# Patient Record
Sex: Male | Born: 1955 | Race: White | Hispanic: No | Marital: Single | Smoking: Former smoker
Health system: Southern US, Community
[De-identification: ages and names within clinical notes are randomized; demographics above are authoritative.]

## PROBLEM LIST (undated history)

## (undated) DIAGNOSIS — I10 Essential (primary) hypertension: Secondary | ICD-10-CM

## (undated) DIAGNOSIS — Z7901 Long term (current) use of anticoagulants: Secondary | ICD-10-CM

## (undated) DIAGNOSIS — I4892 Unspecified atrial flutter: Secondary | ICD-10-CM

## (undated) DIAGNOSIS — J449 Chronic obstructive pulmonary disease, unspecified: Secondary | ICD-10-CM

## (undated) DIAGNOSIS — E785 Hyperlipidemia, unspecified: Secondary | ICD-10-CM

## (undated) DIAGNOSIS — I2510 Atherosclerotic heart disease of native coronary artery without angina pectoris: Secondary | ICD-10-CM

## (undated) HISTORY — DX: Chronic obstructive pulmonary disease, unspecified: J44.9

## (undated) HISTORY — DX: Unspecified atrial flutter: I48.92

## (undated) HISTORY — DX: Atherosclerotic heart disease of native coronary artery without angina pectoris: I25.10

## (undated) HISTORY — DX: Essential (primary) hypertension: I10

## (undated) HISTORY — DX: Hyperlipidemia, unspecified: E78.5

## (undated) HISTORY — DX: Long term (current) use of anticoagulants: Z79.01

---

## 2008-08-07 ENCOUNTER — Emergency Department: Payer: Self-pay | Admitting: Emergency Medicine

## 2008-10-19 HISTORY — PX: CORONARY ARTERY BYPASS GRAFT: SHX141

## 2008-11-18 ENCOUNTER — Ambulatory Visit: Payer: Self-pay | Admitting: Cardiology

## 2008-11-18 ENCOUNTER — Inpatient Hospital Stay (HOSPITAL_COMMUNITY): Admission: RE | Admit: 2008-11-18 | Discharge: 2008-11-24 | Payer: Self-pay | Admitting: *Deleted

## 2008-11-23 HISTORY — PX: OTHER SURGICAL HISTORY: SHX169

## 2008-11-27 ENCOUNTER — Ambulatory Visit: Payer: Self-pay | Admitting: Internal Medicine

## 2008-11-30 ENCOUNTER — Emergency Department: Payer: Self-pay | Admitting: Emergency Medicine

## 2008-12-01 ENCOUNTER — Ambulatory Visit: Payer: Self-pay | Admitting: Internal Medicine

## 2008-12-07 ENCOUNTER — Ambulatory Visit: Payer: Self-pay

## 2008-12-18 ENCOUNTER — Ambulatory Visit: Payer: Self-pay | Admitting: Internal Medicine

## 2008-12-28 ENCOUNTER — Ambulatory Visit: Payer: Self-pay | Admitting: Internal Medicine

## 2008-12-28 DIAGNOSIS — J449 Chronic obstructive pulmonary disease, unspecified: Secondary | ICD-10-CM | POA: Insufficient documentation

## 2008-12-28 DIAGNOSIS — I4892 Unspecified atrial flutter: Secondary | ICD-10-CM | POA: Insufficient documentation

## 2008-12-28 DIAGNOSIS — I2510 Atherosclerotic heart disease of native coronary artery without angina pectoris: Secondary | ICD-10-CM | POA: Insufficient documentation

## 2008-12-28 DIAGNOSIS — I10 Essential (primary) hypertension: Secondary | ICD-10-CM | POA: Insufficient documentation

## 2008-12-28 DIAGNOSIS — E785 Hyperlipidemia, unspecified: Secondary | ICD-10-CM | POA: Insufficient documentation

## 2008-12-28 DIAGNOSIS — J4489 Other specified chronic obstructive pulmonary disease: Secondary | ICD-10-CM | POA: Insufficient documentation

## 2009-05-10 ENCOUNTER — Encounter: Payer: Self-pay | Admitting: *Deleted

## 2009-05-29 ENCOUNTER — Inpatient Hospital Stay: Payer: Self-pay | Admitting: Internal Medicine

## 2009-06-27 ENCOUNTER — Inpatient Hospital Stay: Payer: Self-pay | Admitting: Internal Medicine

## 2009-12-30 ENCOUNTER — Ambulatory Visit: Payer: Self-pay | Admitting: Cardiovascular Disease

## 2010-02-21 ENCOUNTER — Ambulatory Visit: Payer: Self-pay | Admitting: Gastroenterology

## 2010-04-05 IMAGING — CR DG CHEST 1V PORT
1 series · 1 of 1 positions shown · non-contrast
Comparison: none

REASON FOR EXAM: CP
COMMENTS:

PROCEDURE:     DXR - DXR PORTABLE CHEST SINGLE VIEW  - June 26, 2009 [DATE]
RESULT:     Comparison: 05/29/2009

[view not recorded]
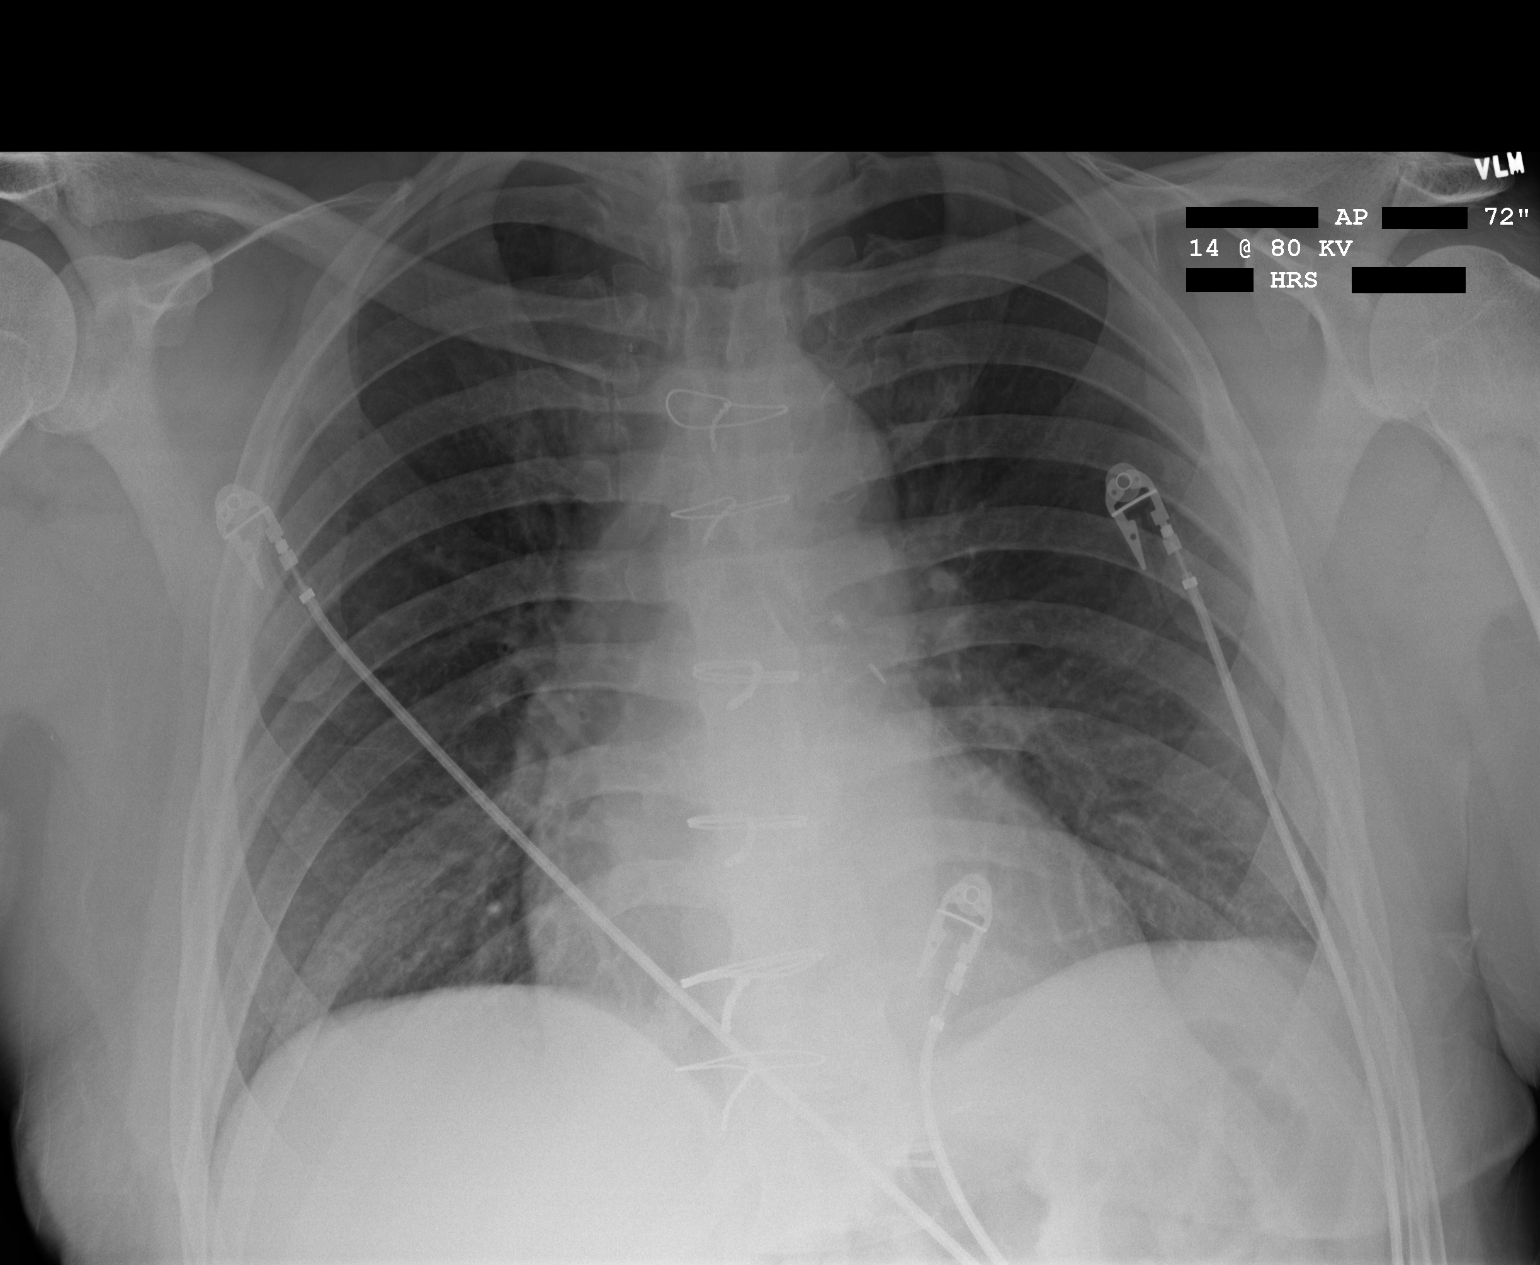

[1 of 1 positions shown; findings below may reference images not displayed]

FINDINGS: Single portable AP chest radiograph is provided. There is no focal
parenchymal opacity, pleural effusion, or pneumothorax. Normal
cardiomediastinal silhouette. There is evidence of prior CABG. The osseous
structures are unremarkable.
IMPRESSION: No acute disease of the chest.

## 2010-04-06 IMAGING — US US EXTREM LOW VENOUS BILAT
1 series · 18 of 24 positions shown · non-contrast
Comparison: none

REASON FOR EXAM: dyspnea, eval for DVT
COMMENTS:

PROCEDURE:     US  - US DOPPLER LOW EXTR BILATERAL  - June 27, 2009  [DATE]
RESULT:     Bilateral lower extremity Duplex Doppler reveals no evidence of
deep venous thrombosis. Color-flow analysis and compression studies are
negative.

[Series 1: us extrem low venous bilat · 18 of 58 slices shown]
[im 1/58]
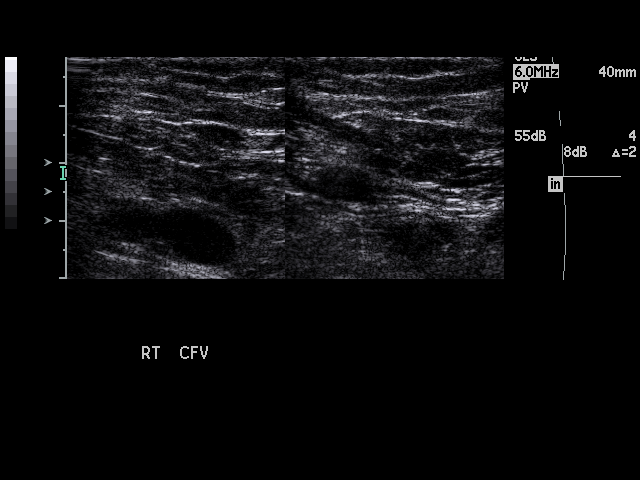
[im 5/58]
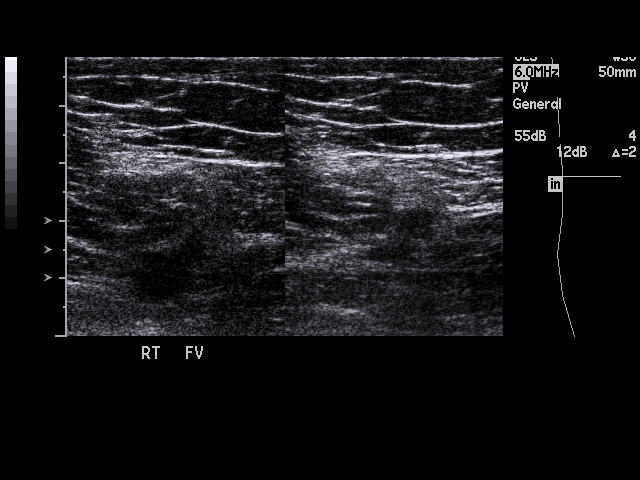
[im 8/58]
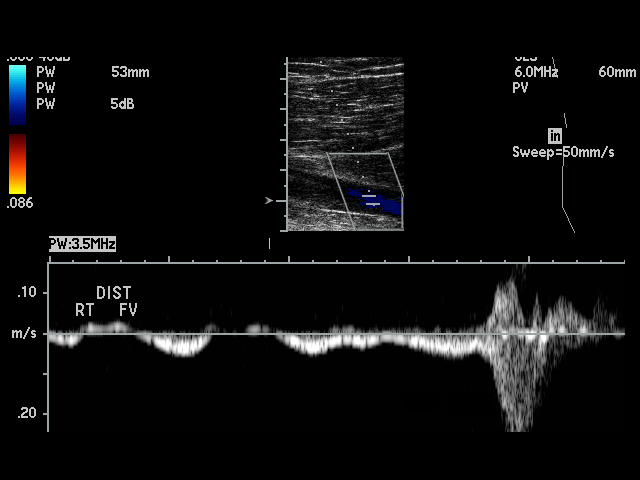
[im 10/58]
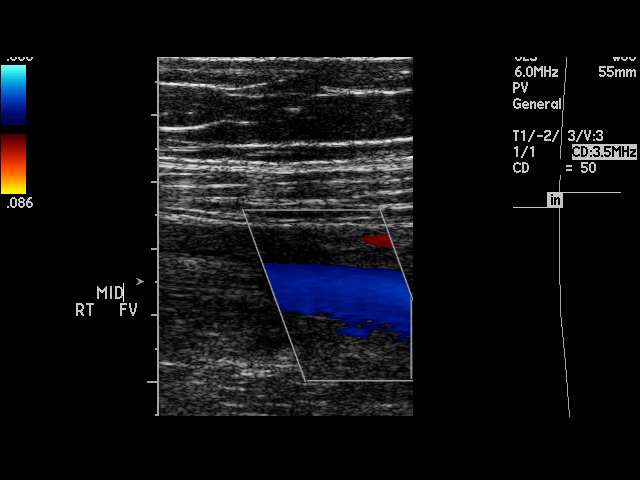
[im 15/58]
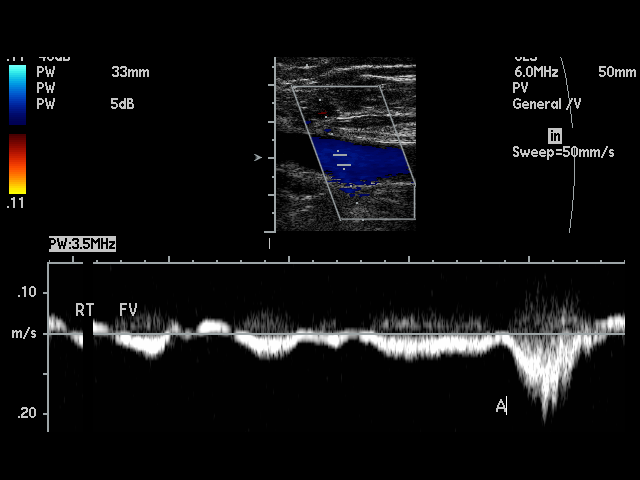
[im 18/58]
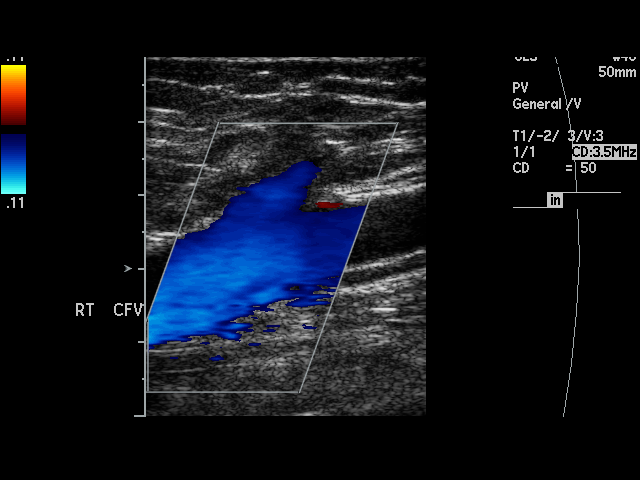
[im 20/58]
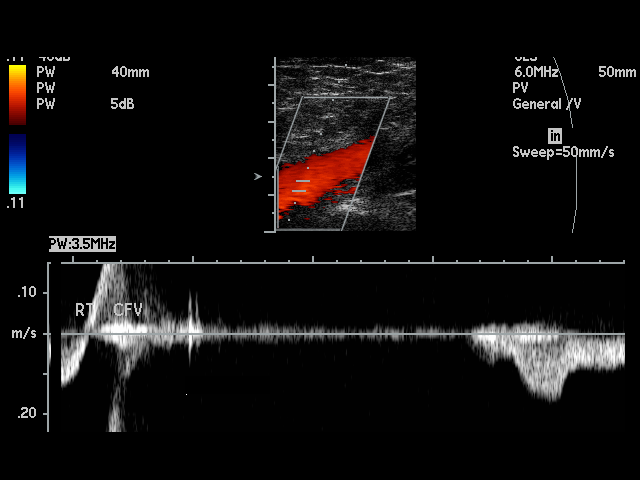
[im 25/58]
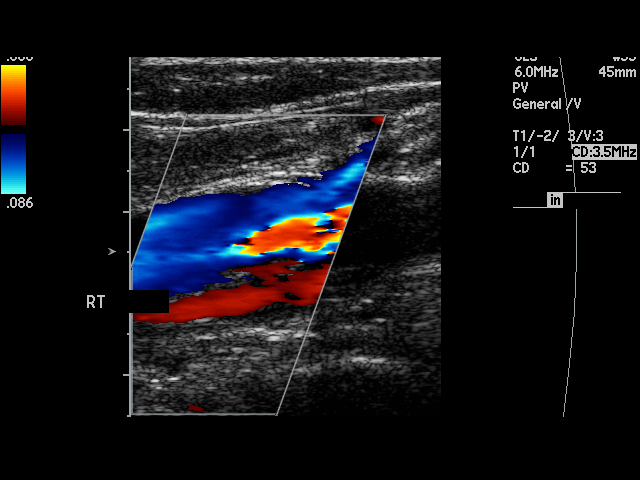
[im 28/58]
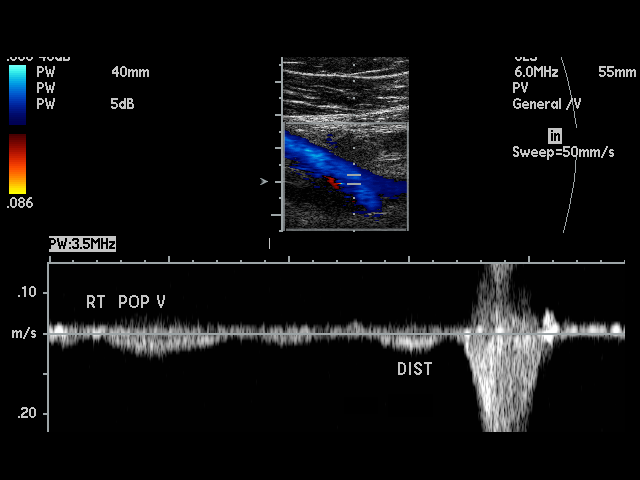
[im 30/58]
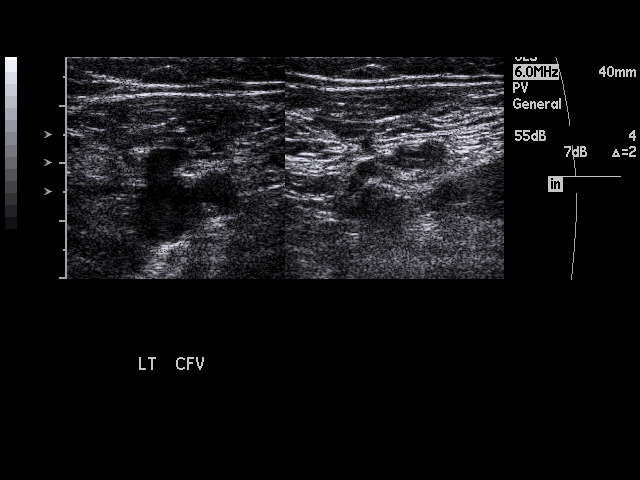
[im 35/58]
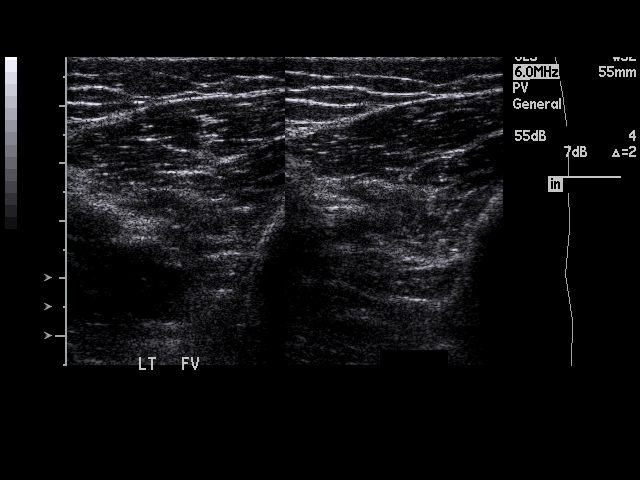
[im 38/58]
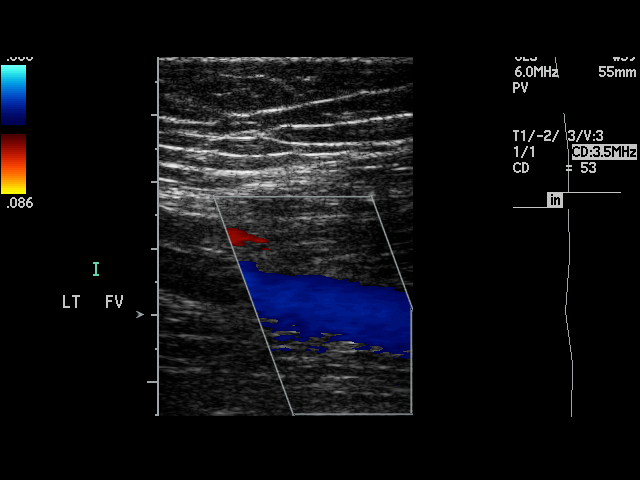
[im 40/58]
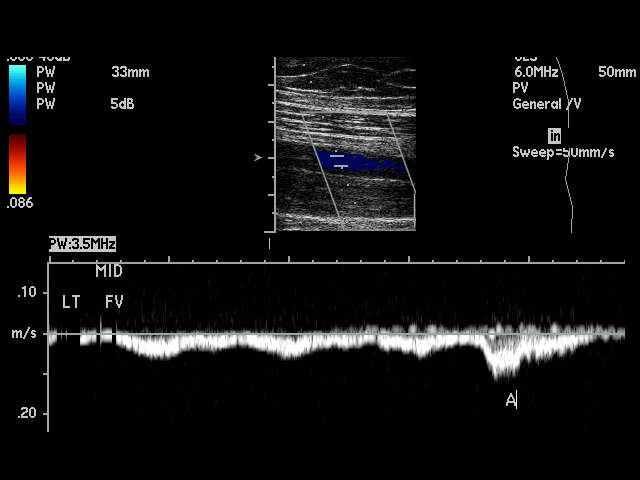
[im 45/58]
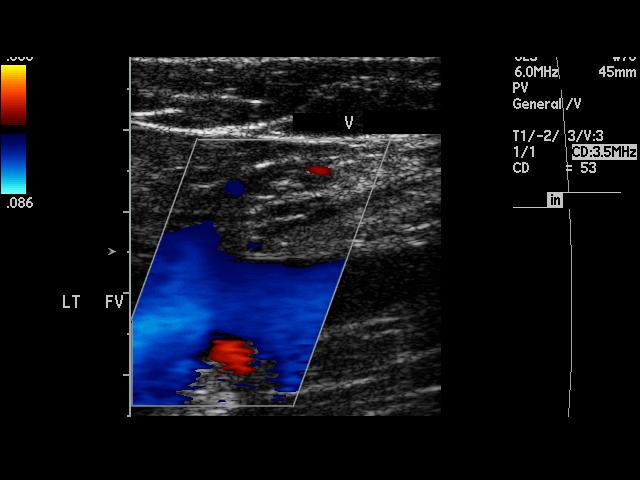
[im 48/58]
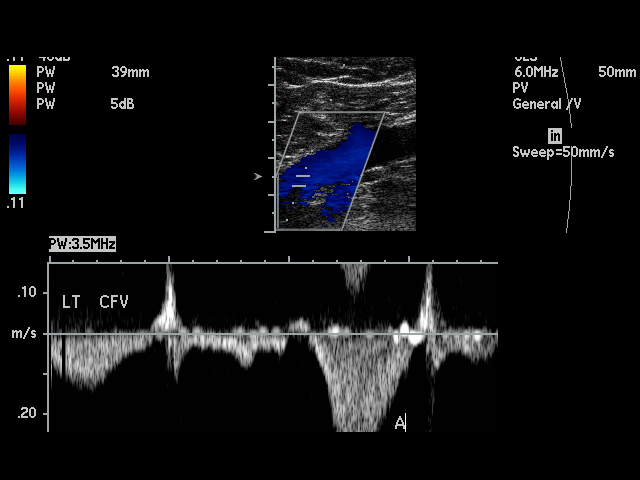
[im 50/58]
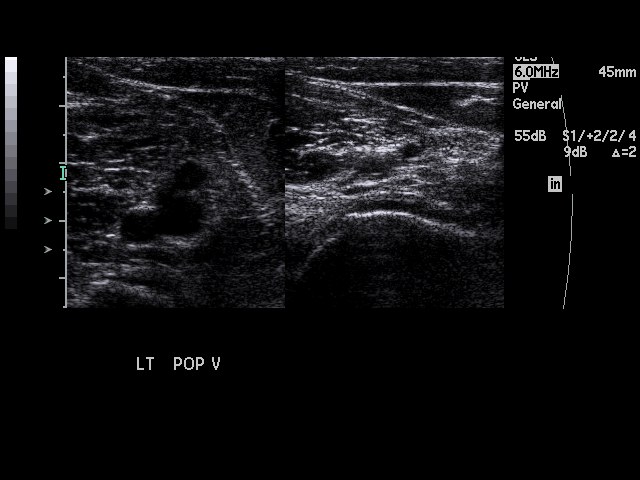
[im 55/58]
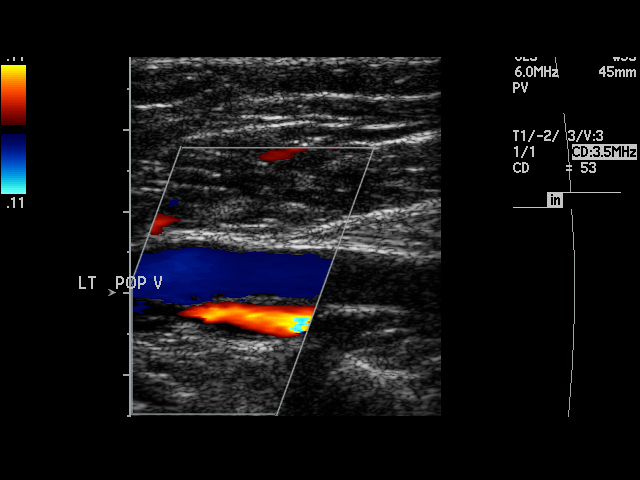
[im 58/58]
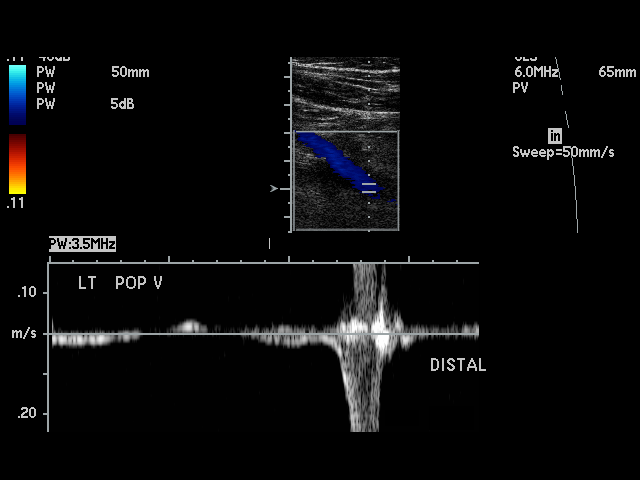

[18 of 24 positions shown; findings below may reference images not displayed]

IMPRESSION: Negative exam.

## 2010-08-07 ENCOUNTER — Inpatient Hospital Stay: Payer: Self-pay | Admitting: Internal Medicine

## 2010-09-07 ENCOUNTER — Ambulatory Visit: Payer: Self-pay | Admitting: Cardiovascular Disease

## 2011-01-05 LAB — BASIC METABOLIC PANEL
BUN: 16 mg/dL (ref 6–23)
Calcium: 9 mg/dL (ref 8.4–10.5)
Chloride: 104 mEq/L (ref 96–112)
Creatinine, Ser: 1.1 mg/dL (ref 0.4–1.5)
GFR calc Af Amer: >60 mL/min (ref >60)
GFR calc non Af Amer: >60 mL/min (ref >60)

## 2011-01-05 LAB — CBC
MCV: 84.3 fL (ref 78.0–100.0)
Platelets: 189 10*3/uL (ref 150–400)
Platelets: 195 10*3/uL (ref 150–400)
RBC: 4.15 MIL/uL — ABNORMAL LOW (ref 4.22–5.81)
RBC: 4.29 MIL/uL (ref 4.22–5.81)
WBC: 7.9 10*3/uL (ref 4.0–10.5)
WBC: 8.5 10*3/uL (ref 4.0–10.5)

## 2011-01-05 LAB — PROTIME-INR
INR: 2.2 — ABNORMAL HIGH (ref 0.00–1.49)
INR: 2.2 — ABNORMAL HIGH (ref 0.00–1.49)
Prothrombin Time: 25.6 seconds — ABNORMAL HIGH (ref 11.6–15.2)
Prothrombin Time: 25.9 seconds — ABNORMAL HIGH (ref 11.6–15.2)

## 2011-01-05 LAB — HEPARIN LEVEL (UNFRACTIONATED): Heparin Unfractionated: 0.4 IU/mL (ref 0.30–0.70)

## 2011-01-10 LAB — POCT I-STAT, CHEM 8
Calcium, Ion: 1.18 mmol/L (ref 1.12–1.32)
Chloride: 104 mEq/L (ref 96–112)
Glucose, Bld: 88 mg/dL (ref 70–99)
HCT: 36 % — ABNORMAL LOW (ref 39.0–52.0)
Hemoglobin: 12.2 g/dL — ABNORMAL LOW (ref 13.0–17.0)
Potassium: 3.8 mEq/L (ref 3.5–5.1)

## 2011-01-10 LAB — BASIC METABOLIC PANEL
BUN: 16 mg/dL (ref 6–23)
BUN: 17 mg/dL (ref 6–23)
BUN: 19 mg/dL (ref 6–23)
BUN: 19 mg/dL (ref 6–23)
BUN: 21 mg/dL (ref 6–23)
CO2: 23 mEq/L (ref 19–32)
CO2: 25 mEq/L (ref 19–32)
CO2: 26 mEq/L (ref 19–32)
Calcium: 8.1 mg/dL — ABNORMAL LOW (ref 8.4–10.5)
Calcium: 8.4 mg/dL (ref 8.4–10.5)
Calcium: 8.6 mg/dL (ref 8.4–10.5)
Chloride: 104 mEq/L (ref 96–112)
Chloride: 104 mEq/L (ref 96–112)
Chloride: 106 mEq/L (ref 96–112)
Creatinine, Ser: 1.08 mg/dL (ref 0.4–1.5)
Creatinine, Ser: 1.1 mg/dL (ref 0.4–1.5)
Creatinine, Ser: 1.12 mg/dL (ref 0.4–1.5)
Creatinine, Ser: 1.19 mg/dL (ref 0.4–1.5)
GFR calc Af Amer: >60 mL/min (ref >60)
GFR calc Af Amer: >60 mL/min (ref >60)
GFR calc non Af Amer: >60 mL/min (ref >60)
Glucose, Bld: 92 mg/dL (ref 70–99)
Glucose, Bld: 97 mg/dL (ref 70–99)
Potassium: 3.8 mEq/L (ref 3.5–5.1)
Potassium: 4.1 mEq/L (ref 3.5–5.1)
Potassium: 4.1 mEq/L (ref 3.5–5.1)

## 2011-01-10 LAB — CBC
HCT: 34.6 % — ABNORMAL LOW (ref 39.0–52.0)
HCT: 35 % — ABNORMAL LOW (ref 39.0–52.0)
HCT: 35.3 % — ABNORMAL LOW (ref 39.0–52.0)
HCT: 36 % — ABNORMAL LOW (ref 39.0–52.0)
HCT: 36.7 % — ABNORMAL LOW (ref 39.0–52.0)
Hemoglobin: 11.8 g/dL — ABNORMAL LOW (ref 13.0–17.0)
Hemoglobin: 11.9 g/dL — ABNORMAL LOW (ref 13.0–17.0)
Hemoglobin: 12 g/dL — ABNORMAL LOW (ref 13.0–17.0)
Hemoglobin: 12.2 g/dL — ABNORMAL LOW (ref 13.0–17.0)
MCHC: 34.2 g/dL (ref 30.0–36.0)
MCHC: 34.2 g/dL (ref 30.0–36.0)
MCHC: 34.9 g/dL (ref 30.0–36.0)
MCV: 84.3 fL (ref 78.0–100.0)
MCV: 85.2 fL (ref 78.0–100.0)
MCV: 85.5 fL (ref 78.0–100.0)
Platelets: 146 10*3/uL — ABNORMAL LOW (ref 150–400)
Platelets: 171 10*3/uL (ref 150–400)
RBC: 4 MIL/uL — ABNORMAL LOW (ref 4.22–5.81)
RBC: 4.05 MIL/uL — ABNORMAL LOW (ref 4.22–5.81)
RBC: 4.19 MIL/uL — ABNORMAL LOW (ref 4.22–5.81)
RBC: 4.36 MIL/uL (ref 4.22–5.81)
RDW: 14.8 % (ref 11.5–15.5)
RDW: 14.9 % (ref 11.5–15.5)
WBC: 8 10*3/uL (ref 4.0–10.5)
WBC: 8.3 10*3/uL (ref 4.0–10.5)
WBC: 8.5 10*3/uL (ref 4.0–10.5)
WBC: 8.6 10*3/uL (ref 4.0–10.5)
WBC: 8.6 10*3/uL (ref 4.0–10.5)

## 2011-01-10 LAB — GLUCOSE, CAPILLARY
Glucose-Capillary: 101 mg/dL — ABNORMAL HIGH (ref 70–99)
Glucose-Capillary: 103 mg/dL — ABNORMAL HIGH (ref 70–99)
Glucose-Capillary: 106 mg/dL — ABNORMAL HIGH (ref 70–99)
Glucose-Capillary: 111 mg/dL — ABNORMAL HIGH (ref 70–99)
Glucose-Capillary: 115 mg/dL — ABNORMAL HIGH (ref 70–99)
Glucose-Capillary: 93 mg/dL (ref 70–99)

## 2011-01-10 LAB — HEPARIN LEVEL (UNFRACTIONATED)
Heparin Unfractionated: 0.25 IU/mL — ABNORMAL LOW (ref 0.30–0.70)
Heparin Unfractionated: 0.42 IU/mL (ref 0.30–0.70)

## 2011-01-10 LAB — CARDIAC PANEL(CRET KIN+CKTOT+MB+TROPI): Relative Index: 18.8 — ABNORMAL HIGH (ref 0.0–2.5)

## 2011-01-10 LAB — PROTIME-INR
INR: 1 (ref 0.00–1.49)
INR: 1 (ref 0.00–1.49)
Prothrombin Time: 13.4 seconds (ref 11.6–15.2)
Prothrombin Time: 13.5 seconds (ref 11.6–15.2)

## 2011-02-07 NOTE — Discharge Summary (Signed)
NAME:  Andrew Bradshaw, OELKE NO.:  [id number]   MEDICAL RECORD NO.:  [id number]          PATIENT TYPE:  INP   LOCATION:  2920                         FACILITY:  [facility]   PHYSICIAN:  Doylene Canning. Ladona Ridgel, MD    DATE OF BIRTH:  1956/06/16   DATE OF ADMISSION:  11/18/2008  DATE OF DISCHARGE:  11/24/2008                               DISCHARGE SUMMARY   This patient has NO KNOWN DRUG ALLERGIES.   FINAL DIAGNOSES:  1. Admitted from [facility] with chest pain.      a.     ACUTE posterior wall myocardial infarction (STEMI).  2. Emergent catheterization November 18, 2008, the saphenous vein      graft to the ramus intermediate is totaled.  The patient underwent      thrombectomy of the distal saphenous vein graft to the ramus      intermediate and a stent was placed.      a.     Long-term outlook for patency of the saphenous vein graft to       the ramus intermediate is not good.  The patient was placed on       Plavix and aspirin kept on Integrilin for 24 hours after the       procedure.      b.     The left internal mammary artery to the left anterior       descending was patent, the saphenous vein graft to the diagonal       patent.  Patient has a right coronary artery that was totally       occluded proximally.  Ejection fraction at catheterization 35% -       40%.  3. Typical atrial flutter noted this admission, it is new.      a.     Transesophageal echocardiogram November 23, 2008:  Ejection       fraction 50%, no left atrial appendage thrombus.      b.     Electrophysiology study November 23, 2008:  Typical atrial       flutter with caval tricuspid isthmus ablation.  Creation of       bidirectional block.  The patient will be discharging day #1       status post ablation of the atrial flutter.   SECONDARY DIAGNOSES:  1. History of three-vessel coronary artery disease.      a.     Status post coronary artery bypass graft surgery October 19, 2008.  2.  Admission September of 2009 for left lower lobe cavitary pneumonia      requiring intervention, long-term intravenous antibiotic therapy.  3. History of hemorrhoids.  4. Perianal abscess status post incision and drainage 2003.  5. 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   Since October 2009 the patient has been living at the [facility].   BRIEF HISTORY:  Mr. [name] is a 55 year old male.  He has known coronary  artery disease.  He had bypass surgery October 19, 2008.   The  patient presented to [facility] Emergency Department with chest pain  and a code STEMI was called.  He was transferred by way of a emergency  medical services to [facility].  He was brought directly to the cath  lab.   Chest pain began at 4:30 on the day of February 24, it feels like  pressure, 8/10 in intensity.  It is similar to the pain from a previous  myocardial infarction.  This is associated with dyspnea.  The pain has  persisted despite aspirin and nitroglycerin given by emergency medical  services.  He was also given fentanyl and diltiazem.  Chest pain  continues as he lies on the table.   HOSPITAL COURSE:  The patient admitted from [place] with acute  posterior wall myocardial infarction.  He underwent emergency  catheterization.  The grafts which were placed at coronary artery bypass  graft surgery January 2010 were imaged, this study showed that the  saphenous vein graft to the ramus intermediate had burden of thrombus  near the distal anastomosis, it was totally occluded.  A thrombectomy  was performed, a PTCA at the anastomosis was performed and a stent was  placed.  Central narrowing from 100% to 50% and improvement in flow.  However, long-term chances of patency are small.  The patient will start  on aspirin and Plavix and go home with this indefinitely.  The other two  grafts, the LIMA to the LAD had a 70% narrowing in the distal portion of  the LIMA graft, the  vein graft to the diagonal branch of the LAD was  patent.  The ejection fraction was 35% - 40%.  Following emergent repair  in the cath lab the patient has had no following chest pain.  He has  developed however atrial flutter, this is a typical pattern.  He was  seen in consultation by Dr. Lewayne Bunting, electrophysiology.  Because  the duration of this atrial flutter was unknown he underwent  transesophageal echocardiogram the morning of November 23, 2008 and then  this was followed by electrophysiology study and radiofrequency catheter  ablation at the caval tricuspid isthmus, a bidirectional block was  created and the dysrhythmia became quiescent.  The patient has been in  sinus rhythm since the procedure.  The patient is also on Coumadin and  will continue on Coumadin as an outpatient for 4 more weeks.  This was  started on February 26 and is therapeutic at the time of his discharge.  The patient has a significant smoking history and will go home on  several medications aimed at his COPD.   MEDICATIONS AT DISCHARGE:  1. Plavix 75 mg daily, which is new.  2. Advair 250/50 one puff twice daily, this is new.  3. Pepcid 20 mg daily, new.  4. Lasix 20 mg daily, new.  5. Digoxin 0.25 mg daily, new.  6. Lisinopril 2.5 mg daily, a new dose.  7. Metoprolol succinate 100 mg daily, new.  8. Coumadin 5 mg tablets, he will take 5 mg on Tuesday March 2, 5 mg      Wednesday March 3, and 2.5 mg or 1/2 tablet Thursday March 4.  He      presents to the Coumadin Clinic Friday March 5.  9. Xopenex inhaler 45 mcg per inhalation, 1 puff every 6 hours, this      is new.  10.Ambien 5 mg to take at bedtime as needed for insomnia, this is also      new.  11.Enteric-coated aspirin 325 mg daily.  12.Pravastatin 40 mg daily at bedtime.  13.Lidoderm patch 5% daily.  14.Percocet 5/325 one to two tablets every 4 - 6 hours as needed for      pain.   The patient is to stop albuterol inhaler.  He is to stop  lisinopril at  the 5 mg daily dose.  He is to stop metoprolol 25 mg twice daily.  Follow-up with [facility] [place].   1. Coumadin Clinic Friday, March 5 at 10:00.  2. He sees Dr. Ladona Ridgel Monday, April 5 at 11:00.   He is to stay on Coumadin until he sees Dr. Ladona Ridgel.   LABORATORY STUDIES:  At the discharge the ProTime is 25.9, INR is 2.2.  Serum electrolytes on March 1:  Sodium was 136, potassium 3.9, chloride  104, carbonate 27, BUN is 16, creatinine 1.1, glucose 91.  Complete  blood count this admission on March 2:  Hemoglobin 12.1, hematocrit 35,  white cells 7.9, platelets are 195, troponin I studies as we have them  on admission February 25:  They were 11.96 and 12.59.      Maple Mirza, PA      Doylene Canning. Ladona Ridgel, MD  Electronically Signed    GM/MEDQ  D:  11/24/2008  T:  11/24/2008  Job:  [id number]   cc:   Doylene Canning. Ladona Ridgel, MD  [facility]

## 2011-02-07 NOTE — Progress Notes (Signed)
 HEALTHCARE                  [facility]' OFFICE NOTE   NAME:Ramus, AMEER                        MRN:          [id number]  DATE:12/28/2008                            DOB:          November 30, 1955    Mr. Geissinger returns for a followup.  He is a pleasant, middle-aged man  with a history of coronary artery disease, status post bypass surgery.  He is status post non-ST elevation MI with stent to the vein graft to  ramus intermedius, which has acutely occluded.  He also has a history of  atrial flutter with rapid ventricular response and underwent catheter  ablation back on November 23, 2008.  He was maintained on Coumadin and  returns today for followup.  The patient denies palpitations.  He denies  chest pain.   MEDICINES:  1. Plavix 75 a day.  2. Advair 250/50 one puff twice a day.  3. Pepcid 20 a day.  4. Lasix 20 a day.  5. Lisinopril 2.5 a day.  6. Metoprolol 100 a day.  7. Warfarin as directed.  8. Xopenex 45 mcg inhaled q.6 h.  9. Aspirin 325 a day.  10.Pravachol 40 a day.  11.Magnesium oxide.  12.Colace 100 twice a day.   PHYSICAL EXAMINATION:  GENERAL:  He is a pleasant, middle-aged, diskempt  man in no distress.  VITAL SIGNS:  Blood pressure was 143/99, the pulse was 100 and regular,  respirations were 18, and the weight was 210 pounds.  NECK:  No jugular venous distention.  LUNGS:  Clear bilaterally to auscultation.  No wheezes, rales, or  rhonchi are present.  There is no increased work of breathing.  CARDIOVASCULAR:  Regular rate and rhythm.  Normal S1 and S2.  ABDOMEN:  Soft, nontender, and nondistended.  There is no organomegaly.  EXTREMITIES:  No cyanosis, clubbing, or edema.  The pulses are 2+  symmetric.   EKG demonstrates sinus rhythm with sinus tachycardia with left atrial  enlargement and prior inferior MI pattern.   IMPRESSION:  1. Ischemic heart disease, status post bypass surgery, status post      stent to  the ramus intermedius.  2. Atrial flutter, status post ablation.  3. Hypertension.  4. Chronic obstructive pulmonary disease.   DISCUSSION:  Mr. Poser is stable.  His atrial flutter has been  controlled since his ablation.  At this point, I recommend that he stop  his Coumadin therapy.  We will continue on his other medical therapies  including aspirin and Plavix.  I will see the patient back as needed.  Because of his ongoing coronary artery disease, having follow up with  one of our additional cardiologist.     Doylene Canning. Ladona Ridgel, MD  Electronically Signed    GWT/MedQ  DD: 12/28/2008  DT: 12/28/2008  Job #: [id number]

## 2011-02-07 NOTE — Discharge Summary (Signed)
NAME:  REBECCA, MOTTA NO.:  [id number]   MEDICAL RECORD NO.:  [id number]          PATIENT TYPE:  INP   LOCATION:  2920                         FACILITY:  [facility]   PHYSICIAN:  Doylene Canning. Ladona Ridgel, MD    DATE OF BIRTH:  1956-03-07   DATE OF ADMISSION:  11/18/2008  DATE OF DISCHARGE:                               DISCHARGE SUMMARY   ADDENDUM:  The time for the dictation that I have just done priority  wise was greater than 45 minutes.      Maple Mirza, PA      Doylene Canning. Ladona Ridgel, MD  Electronically Signed    GM/MEDQ  D:  11/24/2008  T:  11/24/2008  Job:  [id number]

## 2011-02-07 NOTE — Consult Note (Signed)
NAME:  CHAN, SHEAHAN NO.:  [id number]   MEDICAL RECORD NO.:  [id number]          PATIENT TYPE:  INP   LOCATION:  2920                         FACILITY:  [facility]   PHYSICIAN:  Noralyn Pick. Eden Emms, MD, FACCDATE OF BIRTH:  1956-08-22   DATE OF CONSULTATION:  11/23/2008  DATE OF DISCHARGE:                                 CONSULTATION   INDICATION:  A 55 year old patient pre-flutter ablation.   The patient was sedated with 100 mcg of fentanyl and 10 mg of Versed.   Using digital technique, an Omniplane probe was advanced into the  esophagus without incident.  Transgastric imaging revealed mild left  ventricular cavity enlargement with mild-to-moderate LVH, EF was 45-50%  with global hypokinesis, slightly worse in the inferior wall, mitral  valve was mildly thickened with trivial MR, aortic valve was trileaflet  and minimally sclerotic.  There was mild biatrial enlargement.  Right-  sided cardiac chambers were otherwise normal.  Atrial septum was intact  with no ASD.   Left atrial appendage was extremely small and hypercontractile.  There  is no spontaneous contrast or thrombus.   Imaging of the aorta showed no significant bleeding.   IMPRESSION:  No left atrial appendage clot.  See above for details.  The  patient will proceed with atrial flutter ablation.      Noralyn Pick. Eden Emms, MD, [facility]  Electronically Signed     PCN/MEDQ  D:  11/23/2008  T:  11/24/2008  Job:  [id number]

## 2011-02-07 NOTE — Cardiovascular Report (Signed)
NAME:  Andrew Bradshaw, Andrew Bradshaw NO.:  [id number]   MEDICAL RECORD NO.:  [id number]          PATIENT TYPE:  INP   LOCATION:  2909                         FACILITY:  [facility]   PHYSICIAN:  Bruce R. Juanda Chance, MD, FACCDATE OF BIRTH:  1956/07/14   DATE OF PROCEDURE:  11/18/2008  DATE OF DISCHARGE:                            CARDIAC CATHETERIZATION   CLINICAL HISTORY:  Andrew Bradshaw is 55 years old.  He had bypass surgery in  [facility] a month ago with a LIMA to the LAD, a vein graft to the  ramus, a vein graft to the diagonal branch of the LAD.  All his vessels  were diffusely diseased and the right coronary artery was not suitable  for grafting.  He developed chest pain today and went to [facility] where his ECG showed 1-mm ST depression in V2 and V3.  He was  transferred emergently to [facility] and a code STEMI was called.  He  was taken directly to the cath lab.   PROCEDURE:  The procedure was performed via the right femoral artery  using arterial sheath and 6-French preformed coronary catheters.  A  front wall arterial puncture was performed and Omnipaque contrast was  used.  After we identified that there was total occlusion of the vein  graft to the circumflex artery, preparation made for intervention.  The  patient was given antiemetics bolus and infusion and subsequently  received bail out  double bolus Integrilin.  He was given 600 mg of  Plavix and had been previously given 4 chewable aspirin.  We used an AL-  1 6-French guiding catheter with side holes.  We were unable to pass a  Prowater wire across the lesion, but were able to pass a PT2 Light  Support wire across the lesion.  We did not get any distal flow,  however.  We then performed balloon inflations with a 2-0 balloon up and  down the vein graft and established some flow into the distal native  vessel.  It appeared there was a tight stenosis at the anastomosis and  we dilated this with a 2-0 balloon.   There appeared to be a large amount  of thrombus.  We still did not have good flow, so we stented the lesion  at the anastomosis because the result with a PTCA was very suboptimal.  We used a 2.25 x 8-mm Cypher stent deployed at 9 atmospheres.  The flow  was still TIMI 2 and there was thrombus evident in the shaft of the vein  graft, so we went in with an AngioJet and performed a total of 2 passes.  This cleaned up the vein graft very well in its proximal midportion, but  at the distal portion where there was a band, there appeared to be a  filling defect and we were not sure if this was thrombus or dissection.  We then elected to stent this with a 3.5 x 15-mm Vision stent.  This did  not help very much and there still appeared to be thrombus within the  stent.  We then passed a  Fetch catheter and gave intracoronary verapamil  hoping this will improved the distal flow, but it did not make very much  difference.   Following completion of the PCI, we performed angiography of the LIMA  graft and then did a left ventriculogram.  Despite the difficulty of the  procedure, the patient tolerated the procedure well and left the  laboratory in stable condition.   RESULTS:  Left main coronary artery:  The left main coronary artery is  free of significant disease.   Left anterior descending artery:  The left anterior descending artery  gave rise to a small and larger diagonal branch and a large septal  perforator and then there was 80% stenosis in the mid LAD with competing  flow distally.  There was 90% stenosis in the second diagonal branch  which was grafted.   The circumflex artery:  The circumflex artery is a moderate-sized vessel  that was diffusely diseased.  It gave rise to a ramus branch and was  completely occluded.  There was a 90% proximal stenosis.  There were 2  posterolateral branches which were diffusely diseased with diffuse 90%  stenoses.   The right coronary artery:  The  right coronary artery was completely  occluded  proximally.  The distal posterior descending branch was a  small vessel filled via collaterals from the left coronary artery.   The saphenous vein graft to the diagonal branch of the LAD was patent  and function normally, but there was a big mismatch between the graft  and the vessel and there was 80% narrowing in the diagonal branch just  after anastomosis.   The LIMA graft to LAD was patent and function normally, but there was  70% narrowing in the LIMA graft just before the anastomosis.   The saphenous vein graft to the ramus branch was completely occluded.   The left ventriculogram:  The left ventriculogram performed in the RAO  projection showed akinesis of the inferior wall all the way up to the  tip of the apex.  The estimated ejection fraction was 35-40%.   Following aspiration from AngioJet thrombectomy and stenting of the  anastomotic lesion and the distal portion of the vein graft, the  stenosis in the vein graft improved from 100% to 50% in the distal  portion of the vein graft with flow going from TIMI zero to a very slow  TIMI 2 flow.  The distal ramus branch was not a very large vessel.   The patient arrived at [facility] via EMS at 1850 in the cath  lab.  The first balloon inflation was performed at 1937.  This gave a  balloon time of 47 minutes.   CONCLUSION:  1. Coronary artery disease, status post prior coronary artery bypass      graft surgery in January, 1 month ago.  2. Acute posterior wall myocardial infarction due to occlusion of the      vein graft to the ramus branch of circumflex artery.  3. Severe native vessel disease with 80% narrowing in the mid LAD and      90% narrowing in the second diagonal branch of the LAD, total      occlusion of the ramus branch of circumflex artery and 90% stenosis      in the proximal circumflex artery with diffuse 90% lesions in the 2      posterolateral branch of  the circumflex artery and total occlusion      of the right  coronary artery.  4. Patent vein graft to diagonal branch of the LAD, patent LIMA graft      to the LAD with 70% narrowing at the distal portion of the LIMA      graft and occluded vein graft to the ramus branch of the circumflex      artery.  5. Inferior wall akinesis with an estimated ejection fraction of 35-      40%.  6. Suboptimal PTCA result.  The totally occluded vein graft to the      ramus branch of circumflex artery with improvement in central      narrowing from 100% to 50% and improvement in the flow from TIMI      zero to TIMI 2 flow.   DISPOSITION:  The patient returned to [facility] room for further  observation.  The chances of long-term patency of this graft are small.  We will plan on continued medical management.  We will plan to treat him  with Integrilin for 12 hours.      Bruce Elvera Lennox Juanda Chance, MD, [facility]  Electronically Signed    BRB/MEDQ  D:  11/18/2008  T:  11/19/2008  Job:  [id number]   cc:   Luis Abed, MD, [facility]  Cardiopulmonary Lab

## 2011-02-07 NOTE — Op Note (Signed)
NAME:  Andrew Bradshaw, Andrew Bradshaw NO.:  [id number]   MEDICAL RECORD NO.:  [id number]          PATIENT TYPE:  INP   LOCATION:  2920                         FACILITY:  [facility]   PHYSICIAN:  Doylene Canning. Ladona Ridgel, MD    DATE OF BIRTH:  [birth year]   DATE OF PROCEDURE:  11/23/2008  DATE OF DISCHARGE:                               OPERATIVE REPORT   PROCEDURE PERFORMED:  Electrophysiologic study, now for catheter  ablation of atrial flutter.   The patient is a 55 year old male with coronary disease, status post  bypass surgery in the past who presented to the hospital with atrial  flutter and a rapid ventricular response and actually ruled in for a non-  Q-wave myocardial infarction.  He has stabilized, but his flutter has  persisted.  He underwent transesophageal echo demonstrating no left  appendage thrombus and is now referred for electrophysiologic study and  catheter ablation of his atrial flutter.   DESCRIPTION OF PROCEDURE:  After informed consent was obtained, the  patient was taken to the [facility] in a fasting state.  After __________ preparation and draping, intravenous valium and  fentanyl was given for sedation.  A 5-French quadripolar catheter was  inserted percutaneously in the right femoral vein and advanced to the  His bundle region.  A 6-French Hexaport catheter was inserted  percutaneously in the right jugular vein and advanced to the coronary  sinus.  A 7-French quadripolar ablation catheter was inserted  percutaneously in the right femoral vein and advanced to the atrial  flutter isthmus.  Mapping was carried out.  This demonstrated typical  counterclockwise tricuspid annular reentrant atrial flutter with a cycle  length of 200 msec.  Three RF energy applications were delivered and  flutter was terminated, sinus rhythm restored, but spontaneously  returned.  Two additional RF energy applications were then delivered,  resulting in termination of  flutter.  Isthmus block was then  demonstrated with a 6R RF energy application and a 7th bonus RF energy  application was delivered and the patient was observed for approximately  20 minutes at which time there was no atrial flutter isthmus conduction.  The ablation catheter was moved into the right ventricle and rapid  ventricular pacing was carried out at  a cycle length 600 msec and  stepwise decreased and this demonstrated VA dissociation at 600 msec and  stepwise decreased and this demonstrated VA dissection at 600 msec.  Program stimulation was carried out from the RV apex, also demonstrating  VA dissociation at 600 msec.  Next, programmed atrial stimulation was  carried out from the coronary sinus and the high right atrium  demonstrating persistent atrial flutter isthmus block.  Rapid atrial  pacing was carried out down to 380 msec where AV Wenckebach was  observed.  Programmed extrastimulation was carried out down to S1-S2  interval with 600/300 with the AV node ERP observed.  During programmed  extrastimulation there were no H jumps, no echo beats, no inducible SVT.  At this point, the catheters were removed, hemostasis assured and the  patient was returned to his room  in satisfactory condition.  It should  be noted that during the procedure the patient was very difficult to  sedate secondary to prior benzodiazepine tolerance.  Intravenous  fentanyl worked reasonably well in sedation, however.   COMPLICATIONS:  There were no procedure complications.   RESULTS:  A.  Baseline ECG demonstrates atrial flutter with a rapid  ventricular response.  B.  Baseline intervals.  the atrial flutter cycle length was 200 msec,  the And social he the patient for surgery denies drugs alcohol  20.__________ 40 msec, the QRS duration was 100 msec.  C.  Rapid ventricular pacing demonstrated VA dissociation at 600 msec.  D.  Programmed ventricular stimulation.  Programmed ventricular  stimulation  was carried out from the RV apex based on a cycle length of  600 msec.  This demonstrated VA  dissociation.  E. Rapid atrial pacing.  Rapid atrial pacing was carried out from the  coronary sinus from the high right atrium at a cycle length of 600 msec  and stepwise decreased down to380 msec where an AV Wenckebach was  observed.  During rapid atrial pacing, the PR interval was less than the  R interval and there was no inducible SVT.  F.  Programmed atrial stimulation.  Programmed atrial stimulation was  carried out from the coronary sinus in the high right atrium with  baseline cycle length of 600 msec.  The S1-S2 interval was stepwise  decreased down to 300 msec with the AV nodal ERP observed.  During  programmed extrastimulation there were no AHMs, no echo beats.  G.  Rhythm observed.  1.  Atrial flutter.  Initiation was at present at  the time VP study.  Duration was sustained.  Cycle length was 200 msec.  Method of termination was catheter ablation.  H-mapping.  Mapping of the  atrial flutter isthmus demonstrated the usual size and orientation of  the atrial flutter isthmus.  4.  RF energy applications.  A total of 7  RF  applications were delivered __________resulting in termination of  flutter, restoration of sinus rhythm, correction of bidirectional block  and atrial flutter isthmus.  5.  At conclusion of study demonstrates  successful electrophysiologic study and RF energy catheter ablation of  typical atrial flutter with a total of 7 RF energy applications  delivered.      Doylene Canning. Ladona Ridgel, MD  Electronically Signed     GWT/MEDQ  D:  11/23/2008  T:  11/23/2008  Job:  [id number]

## 2011-02-07 NOTE — H&P (Signed)
NAME:  TERRE, ZABRISKIE NO.:  [id number]   MEDICAL RECORD NO.:  [id number]          PATIENT TYPE:  INP   LOCATION:  2909                         FACILITY:  [facility]   PHYSICIAN:  Bruce R. Juanda Chance, MD, FACCDATE OF BIRTH:  10/18/1955   DATE OF ADMISSION:  11/18/2008  DATE OF DISCHARGE:                              HISTORY & PHYSICAL   CHIEF COMPLAINT:  Chest pain.   HISTORY OF PRESENT ILLNESS:  Mr. Fray is a 55 year old with known  coronary artery disease, status post CABG on October 19, 2008 who  presented to the [facility] Emergency Department with chest pain and a  Code STEMI was called and he was transferred via EMS to [facility] and  brought directly to the cath lab.  His chest pain began at 4:30 p.m.  today and feels like pressure and is 8 out of 10 intensity and is  similar to the pain from his previous MI.  This was associated with  dyspnea.  The pain is pleuritic, worse with deep inspiration.  The pain  has persisted despite aspirin and nitroglycerin given by EMS (he was  also given fentanyl and diltiazem by EMS), and the pain continues while  he lies on the cath table.   ALLERGIES:  No known drug allergies.   PAST MEDICAL HISTORY:  1. Coronary artery disease, status post CABG at [facility] on October 19, 2008.  The LIMA was anastomosed to the LAD, vein graft to the first      diagonal, vein graft to the ramus intermedius (RCA and PDA were      very small, less than 1 mm and not bypassable).  Left ventricular      ejection fraction was 40-45% per the operative report.  2. The patient reports having a heart attack in September 2009.  3. The patient reports pneumonia following a heart attack.  Per the      operative report this was cavitary pneumonia requiring intubation.  4. Hypertension.  5. Hyperlipidemia.  6. COPD.   MEDICATIONS:  The patient does not know his medications or the doses;  however there was a list provided by EMS and this included Aspirin,  Plavix, Metoprolol, Pravachol, diltiazem, Colace, Famotidine, Advair,  furosemide, magnesium, albuterol, Vicodin.   SOCIAL HISTORY:  Lives in [place].  Was formerly employed in  [employer].  He is single.  He has Medicaid.  He is a former smoker  having quit 1 month ago, but he has a 60 pack-year history.  He quit  drinking alcohol 10 years ago.   FAMILY HISTORY:  Father died of emphysema.  Mother died of a heart  attack at age 67.   PHYSICAL EXAMINATION:  GENERAL:  The patient is in acute distress  secondary to chest pain and is prepped and draped on the cath stable.  EKG tracings from EMS show the patient to be in atrial flutter with  variable conduction with ST-T changes consistent with posterior  ischemia.   ASSESSMENT/PLAN:  Posterior ST elevation myocardial infarction.  The  patient is undergoing emergent catheterization by Dr.  Brodie.  We have  obtained the operative report regarding his CABG from [facility] which  describes a LIMA to the LAD, saphenous vein graft to the first diagonal,  saphenous vein graft to the ramus intermedius with the RCA too small and  the circumflex severely diseased.  Further management pending results of  catheterization.      Loel Dubonnet, MD  Electronically Signed      Everardo Beals. Juanda Chance, MD, [facility]  Electronically Signed    PN/MEDQ  D:  11/18/2008  T:  11/19/2008  Job:  [id number]

## 2011-03-17 ENCOUNTER — Encounter: Payer: Self-pay | Admitting: Cardiovascular Disease

## 2011-04-28 ENCOUNTER — Emergency Department: Payer: Self-pay | Admitting: Unknown Physician Specialty

## 2011-05-17 IMAGING — CR DG CHEST 1V PORT
1 series · 1 of 1 positions shown · non-contrast
Comparison: none

REASON FOR EXAM: chest pain
COMMENTS:

PROCEDURE:     DXR - DXR PORTABLE CHEST SINGLE VIEW  - August 07, 2010  [DATE]
RESULT:     The lungs are clear. The cardiac silhouette and visualized bony
skeleton are unremarkable. Patient status post median sternotomy. There is
flattening of the hemidiaphragms, mild.

[view not recorded]
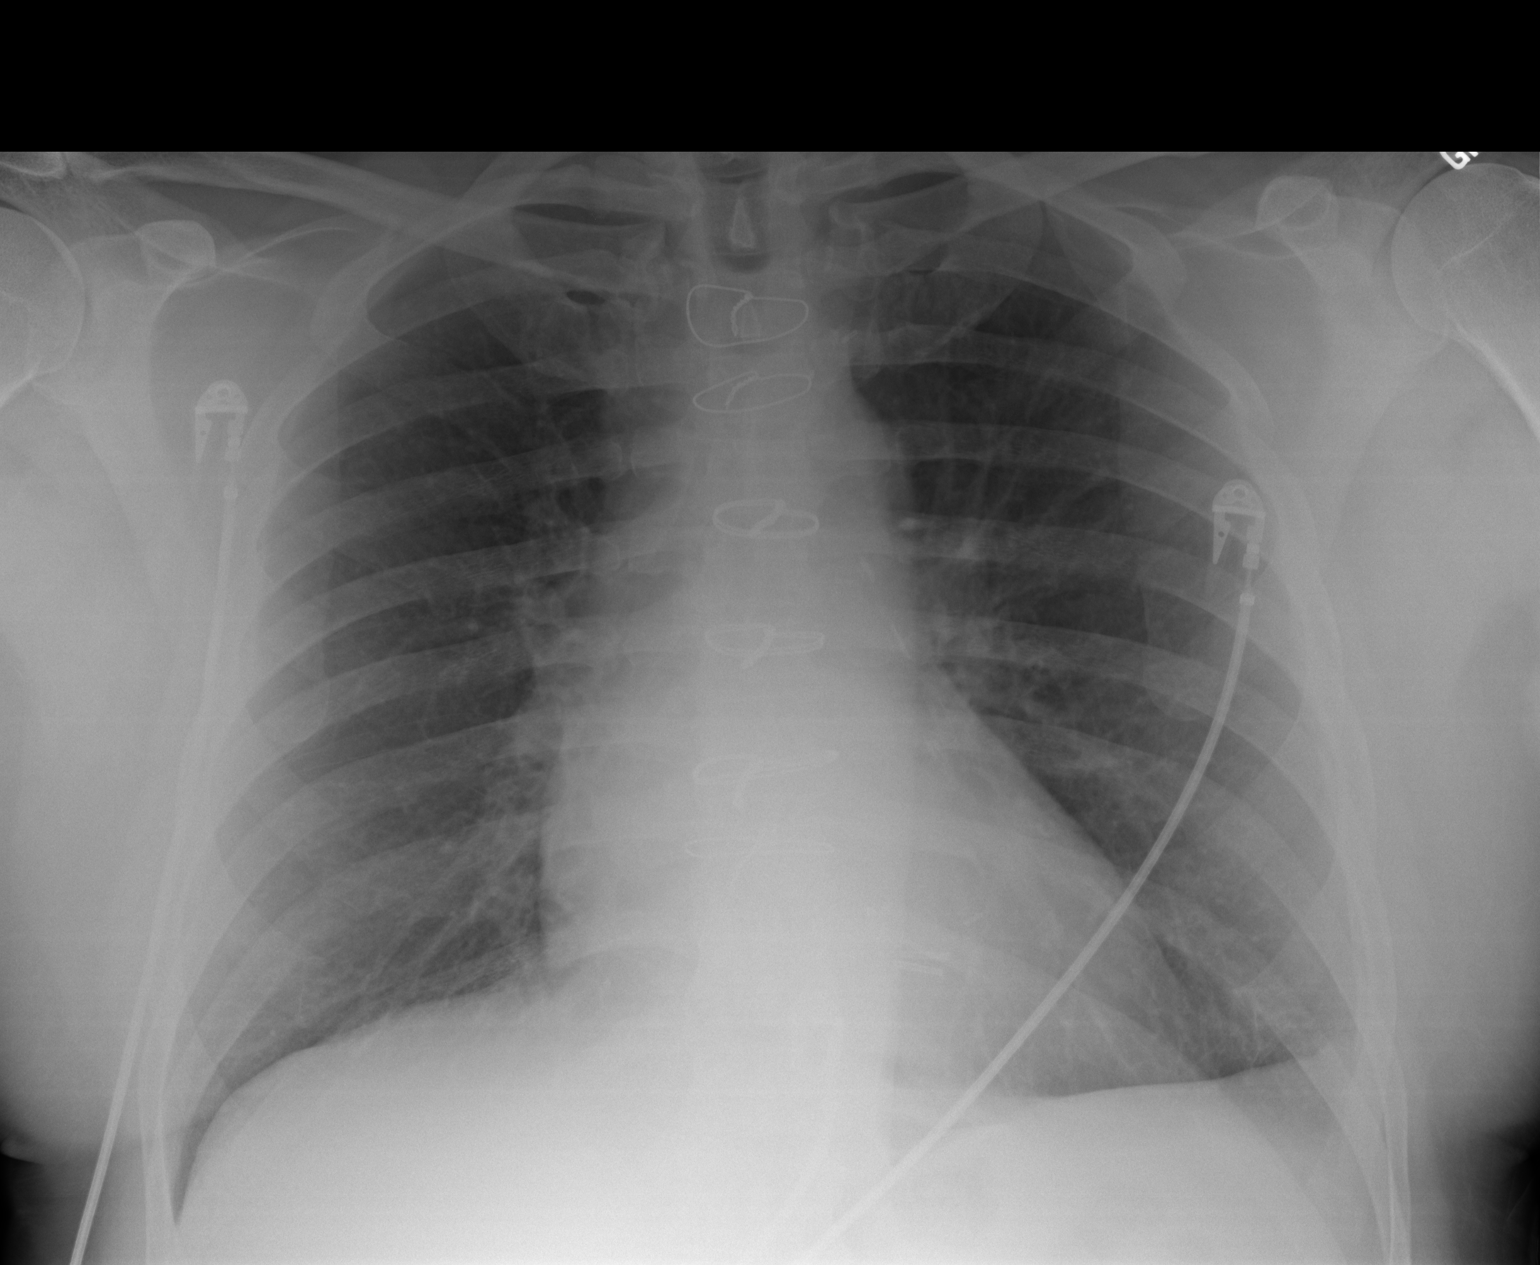

[1 of 1 positions shown; findings below may reference images not displayed]

IMPRESSION: 1. Chest radiograph without evidence of acute cardiopulmonary disease.
2. COPD changes.

## 2012-10-22 ENCOUNTER — Ambulatory Visit: Payer: Self-pay | Admitting: Cardiovascular Disease

## 2012-10-22 LAB — CK TOTAL AND CKMB (NOT AT ARMC): CK, Total: 60 U/L (ref 35–232)

## 2012-10-23 LAB — BASIC METABOLIC PANEL
Anion Gap: 8 (ref 7–16)
BUN: 12 mg/dL (ref 7–18)
Calcium, Total: 8.1 mg/dL — ABNORMAL LOW (ref 8.5–10.1)
Creatinine: 0.98 mg/dL (ref 0.60–1.30)
EGFR (African American): >60
Glucose: 99 mg/dL (ref 65–99)
Osmolality: 277 (ref 275–301)
Potassium: 3.7 mmol/L (ref 3.5–5.1)
Sodium: 139 mmol/L (ref 136–145)

## 2013-12-31 ENCOUNTER — Ambulatory Visit: Payer: Self-pay | Admitting: Surgery

## 2013-12-31 LAB — BASIC METABOLIC PANEL
ANION GAP: 3 — AB (ref 7–16)
BUN: 16 mg/dL (ref 7–18)
CHLORIDE: 103 mmol/L (ref 98–107)
CO2: 32 mmol/L (ref 21–32)
Calcium, Total: 9.1 mg/dL (ref 8.5–10.1)
Creatinine: 1.38 mg/dL — ABNORMAL HIGH (ref 0.60–1.30)
GFR CALC NON AF AMER: 56 — AB
Glucose: 114 mg/dL — ABNORMAL HIGH (ref 65–99)
Osmolality: 278 (ref 275–301)
POTASSIUM: 4.4 mmol/L (ref 3.5–5.1)
Sodium: 138 mmol/L (ref 136–145)

## 2014-01-06 ENCOUNTER — Ambulatory Visit: Payer: Self-pay | Admitting: Surgery

## 2014-01-07 LAB — PATHOLOGY REPORT

## 2014-05-26 DEATH — deceased

## 2015-01-15 NOTE — Discharge Summary (Signed)
Dates of Admission and Diagnosis:   Date of Admission 22-Oct-2012    Date of Discharge 23-Oct-2012    Admitting Diagnosis Chest pain, abn stress test and cath    Final Diagnosis Occlusive CAD s/p PCI    Discharge Diagnosis 1 CAD s/p CABG x 3    2 HTN    3 Hyperlipidemia    4 COPD    5 Chronic back pain    6 Hypergylcemia    7 Sleep apnea, cannot tolerate CPAP    8 Former smoker    9 CHF     Chief Complaint/History of Present Illness Mr. Andrew Bradshaw presented for cardiac cath as he had abnormal stress test. During cardiac cath he had 90% prox RCA lesion and had balloon PTCA with BMS to the prox. RCA. Patient denies CP, chest pressure, SOB at this time. He has mild R groin discomfort.   Cardiology:  28-Jan-14 12:05    Ventricular Rate 69   Atrial Rate 69   P-R Interval 182   QRS Duration 94   QT 412   QTc 441   P Axis 63   R Axis 57   T Axis 95   ECG interpretation Normal sinus rhythm Low voltage QRS Possible Lateral infarct (cited on or before 12-Jan-2009) Inferior-posterior infarct (cited on or before 07-Aug-2008) Abnormal ECG When compared with ECG of 28-Apr-2011 12:08, No significant change was found ----------unconfirmed---------- Confirmed by OVERREAD, NOT ([id number]), editor PEARSON, BARBARA ([id number]) on 10/22/2012 12:12:23 PM  Cardiac:  28-Jan-14 21:26    CK, Total 60   CPK-MB, Serum 1.0 (Result(s) [facility] Oct [facility]:02[facility].)   Hospital Course:   Hospital Course Patient sent to CCU after PTCA and PCI with BMS to RCA. He denies CP, chest pressure, SOB, and telemetry with NSR. There was some mild tenderness in the R groin, but no bleeding from cath site.    Condition on Discharge Stable   DISCHARGE INSTRUCTIONS HOME MEDS:  Medication Reconciliation:  Patient's Home Medications at Discharge:     Medication Instructions  imdur 60 mg oral tablet, extended release  1  orally once a day    famotidine 20 mg oral tablet  1  orally once a day    plavix 75 mg  oral tablet  1  orally once a day    ranexa 1000 mg oral tablet, extended release  1  orally 2 times a day    advair diskus 250 mcg-50 mcg inhalation powder  1  inhaled 2 times a day    nitrostat 0.4 mg sublingual tablet  1 spray(s) sublingual  as needed     ventolin hfa  1 puff(s)  once a day    lasix 40 mg oral tablet  1 tab(s) orally once a day   lisinopril 5 mg oral tablet  1 tab(s) orally once a day   lipitor 80 mg oral tablet  1 tab(s) orally once a day (at bedtime)   opana er 20 mg oral tablet, extended release  1 tab(s) orally every 12 hours   acetaminophen-oxycodone 325 mg-10 mg oral tablet  1 tab(s) orally every 6 hours, As Needed   potassium chloride 20 meq oral tablet, extended release  tab(s) orally once a day   klonopin 1 mg oral tablet  tab(s) orally once a day (at bedtime)   aspirin 325 mg oral delayed release tablet  1 tab(s) orally once a day     STOP TAKING THE FOLLOWING MEDICATION(S):  asa  and change to asa  x 3months  Physician's Instructions:   Home Health? No    Home Oxygen? No    Diet Low Fat, Low Cholesterol    Diet Consistency Regular Consistency    Activity Limitations No exertional activity  No heavy lifting    Return to Work Not Applicable    Time frame for Follow Up Appointment 1-2 days  Friday 10/25/12 at 10:30am   Electronic Signatures: Radene Knee (MD)   (Signed 30-Jan-14 13:26)  Co-Signer: ADMISSION DATE AND DIAGNOSIS, CHIEF COMPLAINT/HPI, PERTINENT LABS, HOSPITAL COURSE, DISCHARGE INSTRUCTIONS HOME MEDS, PATIENT INSTRUCTIONS [name] (PA-C)   (Signed 29-Jan-14 08:36)  Authored: ADMISSION DATE AND DIAGNOSIS, CHIEF COMPLAINT/HPI, PERTINENT LABS, HOSPITAL COURSE, DISCHARGE INSTRUCTIONS HOME MEDS, PATIENT INSTRUCTIONS  Last Updated: 30-Jan-14 13:26 by Radene Knee (MD)

## 2015-01-16 NOTE — Op Note (Signed)
PATIENT NAME:  Andrew Bradshaw, Andrew Bradshaw MR#:  [id number] DATE OF BIRTH:  08/20/1956  DATE OF PROCEDURE:  01/06/2014  PREOPERATIVE DIAGNOSIS: Right posterior scalp sebaceous cyst.   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   PROCEDURE PERFORMED:  Excision of 3 x 3 cm scalp lipoma with overlying skin paddle.   ANESTHESIA: MAC local.   ESTIMATED BLOOD LOSS: 25 mL.   COMPLICATIONS: None.   SPECIMENS: Lipoma with overlying skin and scar tissue.   INDICATION FOR SURGERY:  Mr. Andrew Bradshaw is a pleasant 59 year old male with multiple medical issues who presents with a right posterior scalp mass, which is causing him discomfort. He was thus brought to the operating room for excision of mass.   DETAILS OF PROCEDURE: Informed was obtained. Mr. Andrew Bradshaw was brought to the operating room suite. He was induced, endotracheal tube was placed, general anesthesia was administered. His scalp was prepped and draped in standard surgical fashion. A timeout was then performed correctly identifying the patient name, operative site, and procedure to be performed. After sedation, subcutaneous tissue around this nodule was infiltrated with 1% lidocaine with epinephrine and 0.25% Marcaine with epinephrine. An ellipse was made in the lengthwise of the nodule was deepened down to the subcutaneous tissue and eventually a lipoma was encountered. It was dissected out with some difficulty and sent to pathology. The wound was then irrigated. It was then closed in 2 layers, a deep dermal of 3-0 Vicryl interrupted sutures and a running 4-0 Monocryl subcuticular. Dermabond was then placed over the wound. The patient was then awoken, extubated and brought to the postanesthesia care unit. There were no immediate complications. Needle, sponge, and instrument counts were correct at the end of the procedure.   ____________________________ Si Raiderhristopher A. Filippo Puls, MD cal:tc D: 01/07/2014 11:04:00  ET T: 01/07/2014 11:32:35 ET JOB#: [id number]  cc: Cristal Deerhristopher A. Brinley Rosete, MD, <Dictator>  Jarvis NewcomerHRISTOPHER A Amare Kontos MD ELECTRONICALLY SIGNED 01/12/2014 1:49

## 2015-01-16 NOTE — Op Note (Signed)
PATIENT NAME:  Andrew ChimesMEDLIN, Stirling MR#:  [id number] DATE OF BIRTH:  October 01, 1955  DATE OF PROCEDURE:  01/06/2014  PREOPERATIVE DIAGNOSIS: Right posterior scalp sebaceous cyst.   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   PROCEDURE PERFORMED:  Excision of 3 x 3 cm scalp lipoma with overlying skin paddle.   ANESTHESIA: MAC local.   ESTIMATED BLOOD LOSS: 25 mL.   COMPLICATIONS: None.   SPECIMENS: Lipoma with overlying skin and scar tissue.   INDICATION FOR SURGERY:  [name] is a pleasant 59 year old male with multiple medical issues who presents with a right posterior scalp mass, which is causing him discomfort. He was thus brought to the operating room for excision of mass.   DETAILS OF PROCEDURE: Informed was obtained. [name] was brought to the operating room suite. He was induced, endotracheal tube was placed, general anesthesia was administered. His scalp was prepped and draped in standard surgical fashion. A timeout was then performed correctly identifying the patient name, operative site, and procedure to be performed. After sedation, subcutaneous tissue around this nodule was infiltrated with 1% lidocaine with epinephrine and 0.25% Marcaine with epinephrine. An ellipse was made in the lengthwise of the nodule was deepened down to the subcutaneous tissue and eventually a lipoma was encountered. It was dissected out with some difficulty and sent to pathology. The wound was then irrigated. It was then closed in 2 layers, a deep dermal of 3-0 Vicryl interrupted sutures and a running 4-0 Monocryl subcuticular. Dermabond was then placed over the wound. The patient was then awoken, extubated and brought to the postanesthesia care unit. There were no immediate complications. Needle, sponge, and instrument counts were correct at the end of the procedure.   ____________________________ Si Raiderhristopher A. Jameir Ake, MD cal:tc D: 01/07/2014 11:04:34  ET T: 01/07/2014 11:32:35 ET JOB#: [id number]  cc: Cristal Deerhristopher A. Naydene Kamrowski, MD, <Dictator>
